# Patient Record
Sex: Male | Born: 1990 | Race: Black or African American | Hispanic: No | Marital: Single | Smoking: Never smoker
Health system: Southern US, Community
[De-identification: ages and names within clinical notes are randomized; demographics above are authoritative.]

## PROBLEM LIST (undated history)

## (undated) DIAGNOSIS — J45909 Unspecified asthma, uncomplicated: Secondary | ICD-10-CM

## (undated) DIAGNOSIS — J302 Other seasonal allergic rhinitis: Secondary | ICD-10-CM

## (undated) HISTORY — DX: Other seasonal allergic rhinitis: J30.2

## (undated) HISTORY — DX: Unspecified asthma, uncomplicated: J45.909

---
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
## 2012-01-01 NOTE — Telephone Encounter (Signed)
Pt came in to pick up Tdap shot records.

## 2014-05-06 ENCOUNTER — Ambulatory Visit (INDEPENDENT_AMBULATORY_CARE_PROVIDER_SITE_OTHER): Payer: 59

## 2014-05-06 ENCOUNTER — Ambulatory Visit (INDEPENDENT_AMBULATORY_CARE_PROVIDER_SITE_OTHER): Payer: 59 | Admitting: Family Medicine

## 2014-05-06 VITALS — BP 98/70 | HR 51 | Temp 98.1°F | Resp 16 | Ht 69.5 in | Wt 284.8 lb

## 2014-05-06 DIAGNOSIS — S8001XA Contusion of right knee, initial encounter: Secondary | ICD-10-CM

## 2014-05-06 DIAGNOSIS — M25461 Effusion, right knee: Secondary | ICD-10-CM

## 2014-05-06 NOTE — Patient Instructions (Signed)
I suspect you had a contusion (bruise) to your knee.  See information below. If swelling in knee returns, pain, feels unsteady or giving way, or other worsening - return for recheck.  Return to the clinic or go to the nearest emergency room if any of your symptoms worsen or new symptoms occur.  Contusion A contusion is a deep bruise. Contusions are the result of an injury that caused bleeding under the skin. The contusion may turn blue, purple, or yellow. Minor injuries will give you a painless contusion, but more severe contusions may stay painful and swollen for a few weeks.  CAUSES  A contusion is usually caused by a blow, trauma, or direct force to an area of the body. SYMPTOMS   Swelling and redness of the injured area.  Bruising of the injured area.  Tenderness and soreness of the injured area.  Pain. DIAGNOSIS  The diagnosis can be made by taking a history and physical exam. An X-ray, CT scan, or MRI may be needed to determine if there were any associated injuries, such as fractures. TREATMENT  Specific treatment will depend on what area of the body was injured. In general, the best treatment for a contusion is resting, icing, elevating, and applying cold compresses to the injured area. Over-the-counter medicines may also be recommended for pain control. Ask your caregiver what the best treatment is for your contusion. HOME CARE INSTRUCTIONS   Put ice on the injured area.  Put ice in a plastic bag.  Place a towel between your skin and the bag.  Leave the ice on for 15-20 minutes, 3-4 times a day, or as directed by your health care provider.  Only take over-the-counter or prescription medicines for pain, discomfort, or fever as directed by your caregiver. Your caregiver may recommend avoiding anti-inflammatory medicines (aspirin, ibuprofen, and naproxen) for 48 hours because these medicines may increase bruising.  Rest the injured area.  If possible, elevate the injured area  to reduce swelling. SEEK IMMEDIATE MEDICAL CARE IF:   You have increased bruising or swelling.  You have pain that is getting worse.  Your swelling or pain is not relieved with medicines. MAKE SURE YOU:   Understand these instructions.  Will watch your condition.  Will get help right away if you are not doing well or get worse. Document Released: 04/08/2005 Document Revised: 07/04/2013 Document Reviewed: 05/04/2011 [facility] Patient Information 2015 [place], [place]. This information is not intended to replace advice given to you by your health care provider. Make sure you discuss any questions you have with your health care provider.

## 2014-05-06 NOTE — Progress Notes (Signed)
Subjective:    Patient ID: Reginald Woods, male    DOB: 09/06/1990, 23 y.o.   MRN: [id number]  Knee Pain    Chief Complaint  Patient presents with  . Knee Pain    right knee pain.  has a lump on his knee x 1 week.     This chart was scribed for Meredith StaggersJeffrey Natina Wiginton, MD by Andrew Auaven Small, ED Scribe. This patient was seen in room 8 and the patient's care was started at 2:02 PM.  HPI Comments: Reginald Woods is a 23 y.o. male who presents to the [facility] complaining of a lump in right noticed 1 week ago. Pt reports 2 weeks ago he bumped his knee on another persons knee during basketball practice. Pt reports swelling to entire knee at that time, but had gotten better. 1 week after injury, he noticed a lump in upper knee. Pt denies lump being painful. Pt reports normal gait. He denies taking medication. He denies trouble with running cutting and twisting with basketball.     There are no active problems to display for this patient.  Past Medical History  Diagnosis Date  . Seasonal allergies   . Asthma    No past surgical history on file. No Known Allergies Prior to Admission medications   Medication Sig Start Date End Date Taking? Authorizing Provider  albuterol (PROVENTIL HFA;VENTOLIN HFA) 108 (90 BASE) MCG/ACT inhaler Inhale 2 puffs into the lungs every 6 (six) hours as needed for wheezing or shortness of breath.   Yes Historical Provider, MD   History   Social History  . Marital Status: Single    Spouse Name: N/A    Number of Children: N/A  . Years of Education: N/A   Occupational History  . Not on file.   Social History Main Topics  . Smoking status: Never Smoker   . Smokeless tobacco: Never Used  . Alcohol Use: No  . Drug Use: No  . Sexual Activity: Not on file   Other Topics Concern  . Not on file   Social History Narrative  . No narrative on file   Review of Systems  Musculoskeletal: Negative for arthralgias, gait problem and myalgias.  Skin:  Negative for rash and wound.    Objective:   Physical Exam  Nursing note and vitals reviewed. Constitutional: He is oriented to person, place, and time. He appears well-developed and well-nourished. No distress.  HENT:  Head: Normocephalic and atraumatic.  Eyes: Conjunctivae and EOM are normal.  Neck: Neck supple.  Cardiovascular: Normal rate.   Pulmonary/Chest: Effort normal.  Musculoskeletal: Normal range of motion.  Right knee- full and equal flexion. Full extension. No apparent effusion. Patella non tender. Patella tendon non tender. Joint line non tender. Fibula non tender. Negative varus and valgus. Somewhat difficulty during exam but lockmens appears negative.  There is a small bruise witch is superior and lateral to patella but non tender.  Neurological: He is alert and oriented to person, place, and time.  Skin: Skin is warm and dry.  Psychiatric: He has a normal mood and affect. His behavior is normal.   Filed Vitals:   05/06/14 1336  BP: 98/70  Pulse: 51  Temp: 98.1 F (36.7 C)  TempSrc: Oral  Resp: 16  Height: 5' 9.5" (1.765 m)  Weight: 284 lb 12.8 oz (129.184 kg)  SpO2: 99%   [facility] reading (PRIMARY) by Dr. Neva SeatGreene. Right knee. Negative X-Ray for fractures. No foreign bodies.   Assessment &  Plan:   Reginald Woods is a 23 y.o. male Knee swelling, right - Plan: DG Knee Complete 4 Views Right  Contusion of knee, right, initial encounter - Plan: DG Knee Complete 4 Views Right  Suspected soft tissue contusion, nontender and no bony ttp. No appreciable joint effusion or mechanical joint sx's at this time. Sx care and rtc precautions discussed as below.   Meds ordered this encounter  Medications  . albuterol (PROVENTIL HFA;VENTOLIN HFA) 108 (90 BASE) MCG/ACT inhaler    Sig: Inhale 2 puffs into the lungs every 6 (six) hours as needed for wheezing or shortness of breath.   Patient Instructions  I suspect you had a contusion (bruise) to your knee.  See information  below. If swelling in knee returns, pain, feels unsteady or giving way, or other worsening - return for recheck.  Return to the clinic or go to the nearest emergency room if any of your symptoms worsen or new symptoms occur.  Contusion A contusion is a deep bruise. Contusions are the result of an injury that caused bleeding under the skin. The contusion may turn blue, purple, or yellow. Minor injuries will give you a painless contusion, but more severe contusions may stay painful and swollen for a few weeks.  CAUSES  A contusion is usually caused by a blow, trauma, or direct force to an area of the body. SYMPTOMS   Swelling and redness of the injured area.  Bruising of the injured area.  Tenderness and soreness of the injured area.  Pain. DIAGNOSIS  The diagnosis can be made by taking a history and physical exam. An X-ray, CT scan, or MRI may be needed to determine if there were any associated injuries, such as fractures. TREATMENT  Specific treatment will depend on what area of the body was injured. In general, the best treatment for a contusion is resting, icing, elevating, and applying cold compresses to the injured area. Over-the-counter medicines may also be recommended for pain control. Ask your caregiver what the best treatment is for your contusion. HOME CARE INSTRUCTIONS   Put ice on the injured area.  Put ice in a plastic bag.  Place a towel between your skin and the bag.  Leave the ice on for 15-20 minutes, 3-4 times a day, or as directed by your health care provider.  Only take over-the-counter or prescription medicines for pain, discomfort, or fever as directed by your caregiver. Your caregiver may recommend avoiding anti-inflammatory medicines (aspirin, ibuprofen, and naproxen) for 48 hours because these medicines may increase bruising.  Rest the injured area.  If possible, elevate the injured area to reduce swelling. SEEK IMMEDIATE MEDICAL CARE IF:   You have  increased bruising or swelling.  You have pain that is getting worse.  Your swelling or pain is not relieved with medicines. MAKE SURE YOU:   Understand these instructions.  Will watch your condition.  Will get help right away if you are not doing well or get worse. Document Released: 04/08/2005 Document Revised: 07/04/2013 Document Reviewed: 05/04/2011 [facility] Patient Information 2015 [place], [place]. This information is not intended to replace advice given to you by your health care provider. Make sure you discuss any questions you have with your health care provider.     I personally performed the services described in this documentation, which was scribed in my presence. The recorded information has been reviewed and considered, and addended by me as needed.

## 2015-05-04 ENCOUNTER — Emergency Department (HOSPITAL_COMMUNITY)
Admission: EM | Admit: 2015-05-04 | Discharge: 2015-05-04 | Disposition: A | Discharge status: Home / Self Care | Payer: 59 | Source: Home / Self Care | Attending: Family Medicine | Admitting: Family Medicine

## 2015-05-04 ENCOUNTER — Emergency Department (INDEPENDENT_AMBULATORY_CARE_PROVIDER_SITE_OTHER): Payer: 59

## 2015-05-04 DIAGNOSIS — S92301A Fracture of unspecified metatarsal bone(s), right foot, initial encounter for closed fracture: Secondary | ICD-10-CM

## 2015-05-04 NOTE — ED Notes (Signed)
[facility] a [facility] of [facility] injury to his right foot. The patient stated that he was playing basketball and came down on his foot and heard " a bone crack" and felt pain. [facility] post op [facility].

## 2015-05-04 NOTE — Discharge Instructions (Signed)
It is a pleasure to see you today.  The pain in your right foot is from a fracture in the fifth metatarsal bone (see the print-out of the x-ray).  DO NOT BEAR WEIGHT ON YOUR RIGHT FOOT UNTIL YOU SEE THE ORTHOPEDIC DOCTOR.  Call their office on Monday morning to arrange to be seen.  We are giving you crutches; use the post-op shoe you have until then.   You may use ibuprofen 200mg  tablets, take 2 to 4 tablets by mouth every 6 to 8 hours as needed for pain and swelling.  Elevate the foot when possible.    Metatarsal Fracture A metatarsal fracture is a break in a metatarsal bone. Metatarsal bones connect your toe bones to your ankle bones. CAUSES This type of fracture may be caused by:  A sudden twisting of your foot.  A fall onto your foot.  Overuse or repetitive exercise. RISK FACTORS This condition is more likely to develop in people who:  Play contact sports.  Have a bone disease.  Have a low calcium level. SYMPTOMS Symptoms of this condition include:  Pain that is worse when walking or standing.  Pain when pressing on the foot or moving the toes.  Swelling.  Bruising on the top or bottom of the foot.  A foot that appears shorter than the other one. DIAGNOSIS This condition is diagnosed with a physical exam. You may also have imaging tests, such as:  X-rays.  A CT scan.  MRI. TREATMENT Treatment for this condition depends on its severity and whether a bone has moved out of place. Treatment may involve:  Rest.  Wearing foot support such as a cast, splint, or boot for several weeks.  Using crutches.  Surgery to move bones back into the right position. Surgery is usually needed if there are many pieces of broken bone or bones that are very out of place (displaced fracture).  Physical therapy. This may be needed to help you regain full movement and strength in your foot. You will need to return to your health care provider to have X-rays taken until your bones  heal. Your health care provider will look at the X-rays to make sure that your foot is healing well. HOME CARE INSTRUCTIONS  If You Have a Cast:  Do not stick anything inside the cast to scratch your skin. Doing that increases your risk of infection.  Check the skin around the cast every day. Report any concerns to your health care provider. You may put lotion on dry skin around the edges of the cast. Do not apply lotion to the skin underneath the cast.  Keep the cast clean and dry. If You Have a Splint or a Supportive Boot:  Wear it as directed by your health care provider. Remove it only as directed by your health care provider.  Loosen it if your toes become numb and tingle, or if they turn cold and blue.  Keep it clean and dry. Bathing  Do not take baths, swim, or use a hot tub until your health care provider approves. Ask your health care provider if you can take showers. You may only be allowed to take sponge baths for bathing.  If your health care provider approves bathing and showering, cover the cast or splint with a watertight plastic bag to protect it from water. Do not let the cast or splint get wet. Managing Pain, Stiffness, and Swelling  If directed, apply ice to the injured area (if you have a  splint, not a cast).  Put ice in a plastic bag.  Place a towel between your skin and the bag.  Leave the ice on for 20 minutes, 2-3 times per day.  Move your toes often to avoid stiffness and to lessen swelling.  Raise (elevate) the injured area above the level of your heart while you are sitting or lying down. Driving  Do not drive or operate heavy machinery while taking pain medicine.  Do not drive while wearing foot support on a foot that you use for driving. Activity  Return to your normal activities as directed by your health care provider. Ask your health care provider what activities are safe for you.  Perform exercises as directed by your health care provider or  physical therapist. Safety  Do not use the injured foot to support your body weight until your health care provider says that you can. Use crutches as directed by your health care provider. General Instructions  Do not put pressure on any part of the cast or splint until it is fully hardened. This may take several hours.  Do not use any tobacco products, including cigarettes, chewing tobacco, or e-cigarettes. Tobacco can delay bone healing. If you need help quitting, ask your health care provider.  Take medicines only as directed by your health care provider.  Keep all follow-up visits as directed by your health care provider. This is important. SEEK MEDICAL CARE IF:  You have a fever.  Your cast, splint, or boot is too loose or too tight.  Your cast, splint, or boot is damaged.  Your pain medicine is not helping.  You have pain, tingling, or numbness in your foot that is not going away. SEEK IMMEDIATE MEDICAL CARE IF:  You have severe pain.  You have tingling or numbness in your foot that is getting worse.  Your foot feels cold or becomes numb.  Your foot changes color.   This information is not intended to replace advice given to you by your health care provider. Make sure you discuss any questions you have with your health care provider.   Document Released: 03/21/2002 Document Revised: 11/13/2014 Document Reviewed: 04/25/2014 Elsevier Interactive Patient Education [employer]

## 2015-05-04 NOTE — ED Provider Notes (Signed)
CSN: [id number]     Arrival date & time 05/04/15  1840 History   First MD Initiated Contact with Patient 05/04/15 1938     Chief Complaint  Patient presents with  . Foot Injury   (Consider location/radiation/quality/duration/timing/severity/associated sxs/prior Treatment) Patient is a 24 y.o. male presenting with foot injury. The history is provided by the patient. No language interpreter was used.  Foot Injury Associated symptoms: no fever   [facility] of sudden onset R mid foot pain while playing basketball at 5pm this afternoon. Came down on the R foot, heard a pop and sudden severe pain. Has not been able to bear weight since that time.  Had a post-op shoe from prior nail avulsion procedure, [facility]   No prior fracture to R foot.   Past Medical History  Diagnosis Date  . Seasonal allergies   . Asthma    No past surgical history on file. No family history on file. Social History  Substance Use Topics  . Smoking status: Never Smoker   . Smokeless tobacco: Never Used  . Alcohol Use: No    Review of Systems  Constitutional: Negative for fever and chills.    Allergies  Review of patient's allergies indicates no known allergies.  Home Medications   Prior to Admission medications   Medication Sig Start Date End Date Taking? Authorizing Provider  albuterol (PROVENTIL HFA;VENTOLIN HFA) 108 (90 BASE) MCG/ACT inhaler Inhale 2 puffs into the lungs every 6 (six) hours as needed for wheezing or shortness of breath.    Historical Provider, MD   Meds Ordered and Administered this Visit  Medications - No data to display  BP 127/81 mmHg  Pulse 96  Temp(Src) 98.5 F (36.9 C) (Oral)  SpO2 97% No data found.   Physical Exam  Constitutional:  Generally well appearing, no distress. Sitting with R lower extremity elevated on stool   Neck: Neck supple.  Musculoskeletal:  R foot with palpable dp pulse. Sensation in distal toes  intact in all digits, cap refill <3 seconds in all toes.  Point tenderness over the R fifth metatarsal.    Full active ROM R ankle.  No tenderness to squeeze medial and lateral malleoli of R ankle.     ED Course  Procedures (including critical care time)  Labs Review Labs Reviewed - No data to display  Imaging Review Dg Foot Complete Right  05/04/2015  CLINICAL DATA:  Right foot pain/injury EXAM: RIGHT FOOT COMPLETE - 3+ VIEW COMPARISON:  None. FINDINGS: Nondisplaced fracture involving the proximal shaft of the 5th metatarsal. However, the appearance suggests the possibility of a prior underlying stress reaction in this location. No additional fracture is seen. The joint spaces are preserved. Overlying soft tissue swelling. IMPRESSION: Nondisplaced fracture involving the proximal shaft of the 5th metatarsal, as described above. Electronically Signed   By: Charline BillsSriyesh  Krishnan M.D.   On: 05/04/2015 19:42     Visual Acuity Review  Right Eye Distance:   Left Eye Distance:   Bilateral Distance:    Right Eye Near:   Left Eye Near:    Bilateral Near:         MDM   1. Fracture of fifth metatarsal bone, right, closed, initial encounter    Nondisplaced fracture of proximal R metatarsal bone.  He has his own post-op shoe. Non-weight bearing, crutches, given contact information for Orthopedics on-call to call on Monday and arrange visit.  Barbaraann Barthel, MD 05/04/15 [phone]

## 2015-05-09 ENCOUNTER — Other Ambulatory Visit: Payer: Self-pay | Admitting: Orthopedic Surgery

## 2015-05-13 ENCOUNTER — Encounter (HOSPITAL_BASED_OUTPATIENT_CLINIC_OR_DEPARTMENT_OTHER): Payer: Self-pay | Admitting: *Deleted

## 2015-05-15 ENCOUNTER — Encounter (HOSPITAL_BASED_OUTPATIENT_CLINIC_OR_DEPARTMENT_OTHER)
Admission: RE | Disposition: A | Discharge status: Home / Self Care | Payer: Self-pay | Source: Ambulatory Visit | Attending: Orthopedic Surgery

## 2015-05-15 ENCOUNTER — Encounter (HOSPITAL_BASED_OUTPATIENT_CLINIC_OR_DEPARTMENT_OTHER): Payer: Self-pay | Admitting: *Deleted

## 2015-05-15 ENCOUNTER — Ambulatory Visit (HOSPITAL_BASED_OUTPATIENT_CLINIC_OR_DEPARTMENT_OTHER)
Admission: RE | Admit: 2015-05-15 | Discharge: 2015-05-15 | Disposition: A | Discharge status: Home / Self Care | Payer: 59 | Source: Ambulatory Visit | Attending: Orthopedic Surgery | Admitting: Orthopedic Surgery

## 2015-05-15 ENCOUNTER — Ambulatory Visit (HOSPITAL_BASED_OUTPATIENT_CLINIC_OR_DEPARTMENT_OTHER): Payer: 59 | Admitting: Anesthesiology

## 2015-05-15 DIAGNOSIS — S92351A Displaced fracture of fifth metatarsal bone, right foot, initial encounter for closed fracture: Secondary | ICD-10-CM | POA: Diagnosis present

## 2015-05-15 DIAGNOSIS — Z79899 Other long term (current) drug therapy: Secondary | ICD-10-CM | POA: Diagnosis not present

## 2015-05-15 DIAGNOSIS — W010XXA Fall on same level from slipping, tripping and stumbling without subsequent striking against object, initial encounter: Secondary | ICD-10-CM | POA: Insufficient documentation

## 2015-05-15 DIAGNOSIS — Z91013 Allergy to seafood: Secondary | ICD-10-CM | POA: Diagnosis not present

## 2015-05-15 DIAGNOSIS — J45909 Unspecified asthma, uncomplicated: Secondary | ICD-10-CM | POA: Diagnosis not present

## 2015-05-15 HISTORY — PX: ORIF TOE FRACTURE: SHX5032

## 2015-05-15 SURGERY — OPEN REDUCTION INTERNAL FIXATION (ORIF) METATARSAL (TOE) FRACTURE
Anesthesia: General | Site: Foot | Laterality: Right

## 2015-05-15 MED ORDER — OXYCODONE HCL 5 MG PO TABS
5.0000 mg | ORAL_TABLET | Freq: Once | ORAL | Status: AC
  Administered 2015-05-15: 5 mg via ORAL

## 2015-05-15 MED ORDER — DEXAMETHASONE SODIUM PHOSPHATE 4 MG/ML IJ SOLN
INTRAMUSCULAR | Status: DC | PRN
  Administered 2015-05-15: 10 mg via INTRAVENOUS

## 2015-05-15 MED ORDER — LIDOCAINE HCL (CARDIAC) 20 MG/ML IV SOLN
INTRAVENOUS | Status: AC
  Filled 2015-05-15: qty 5

## 2015-05-15 MED ORDER — LACTATED RINGERS IV SOLN: INTRAVENOUS | Status: DC

## 2015-05-15 MED ORDER — MIDAZOLAM HCL 2 MG/2ML IJ SOLN
1.0000 mg | INTRAMUSCULAR | Status: DC | PRN
  Administered 2015-05-15: 1 mg via INTRAVENOUS

## 2015-05-15 MED ORDER — OXYCODONE-ACETAMINOPHEN 5-325 MG PO TABS: 1.0000 | ORAL_TABLET | Freq: Four times a day (QID) | ORAL | Status: DC | PRN

## 2015-05-15 MED ORDER — FENTANYL CITRATE (PF) 100 MCG/2ML IJ SOLN
INTRAMUSCULAR | Status: AC
  Filled 2015-05-15: qty 4

## 2015-05-15 MED ORDER — ASPIRIN EC 325 MG PO TBEC: 325.0000 mg | DELAYED_RELEASE_TABLET | Freq: Every day | ORAL | Status: DC

## 2015-05-15 MED ORDER — BUPIVACAINE-EPINEPHRINE (PF) 0.5% -1:200000 IJ SOLN
INTRAMUSCULAR | Status: DC | PRN
  Administered 2015-05-15: 30 mL via PERINEURAL

## 2015-05-15 MED ORDER — KETOROLAC TROMETHAMINE 30 MG/ML IJ SOLN
INTRAMUSCULAR | Status: DC | PRN
  Administered 2015-05-15: 30 mg via INTRAVENOUS

## 2015-05-15 MED ORDER — FENTANYL CITRATE (PF) 100 MCG/2ML IJ SOLN
INTRAMUSCULAR | Status: AC
  Filled 2015-05-15: qty 2

## 2015-05-15 MED ORDER — MEPERIDINE HCL 25 MG/ML IJ SOLN: 6.2500 mg | INTRAMUSCULAR | Status: DC | PRN

## 2015-05-15 MED ORDER — ONDANSETRON HCL 4 MG/2ML IJ SOLN
INTRAMUSCULAR | Status: DC | PRN
  Administered 2015-05-15: 4 mg via INTRAVENOUS

## 2015-05-15 MED ORDER — CEFAZOLIN SODIUM 1-5 GM-% IV SOLN
INTRAVENOUS | Status: AC
  Filled 2015-05-15: qty 50

## 2015-05-15 MED ORDER — PROMETHAZINE HCL 25 MG/ML IJ SOLN: 6.2500 mg | INTRAMUSCULAR | Status: DC | PRN

## 2015-05-15 MED ORDER — CEFAZOLIN SODIUM-DEXTROSE 2-3 GM-% IV SOLR
2.0000 g | INTRAVENOUS | Status: AC
  Administered 2015-05-15: 2 g via INTRAVENOUS

## 2015-05-15 MED ORDER — SCOPOLAMINE 1 MG/3DAYS TD PT72: 1.0000 | MEDICATED_PATCH | Freq: Once | TRANSDERMAL | Status: DC | PRN

## 2015-05-15 MED ORDER — PROPOFOL 10 MG/ML IV BOLUS
INTRAVENOUS | Status: DC | PRN
  Administered 2015-05-15: 30 mg via INTRAVENOUS
  Administered 2015-05-15: 200 mg via INTRAVENOUS

## 2015-05-15 MED ORDER — LIDOCAINE HCL (CARDIAC) 20 MG/ML IV SOLN
INTRAVENOUS | Status: DC | PRN
  Administered 2015-05-15: 50 mg via INTRAVENOUS

## 2015-05-15 MED ORDER — CHLORHEXIDINE GLUCONATE 4 % EX LIQD: 60.0000 mL | Freq: Once | CUTANEOUS | Status: DC

## 2015-05-15 MED ORDER — DEXAMETHASONE SODIUM PHOSPHATE 10 MG/ML IJ SOLN
INTRAMUSCULAR | Status: DC | PRN
  Administered 2015-05-15: 10 mg via INTRAVENOUS

## 2015-05-15 MED ORDER — GLYCOPYRROLATE 0.2 MG/ML IJ SOLN: 0.2000 mg | Freq: Once | INTRAMUSCULAR | Status: DC | PRN

## 2015-05-15 MED ORDER — CEFAZOLIN SODIUM-DEXTROSE 2-3 GM-% IV SOLR
INTRAVENOUS | Status: AC
  Filled 2015-05-15: qty 50

## 2015-05-15 MED ORDER — HYDROMORPHONE HCL 1 MG/ML IJ SOLN: 0.2500 mg | INTRAMUSCULAR | Status: DC | PRN

## 2015-05-15 MED ORDER — FENTANYL CITRATE (PF) 100 MCG/2ML IJ SOLN
INTRAMUSCULAR | Status: DC | PRN
  Administered 2015-05-15: 100 ug via INTRAVENOUS

## 2015-05-15 MED ORDER — FENTANYL CITRATE (PF) 100 MCG/2ML IJ SOLN
50.0000 ug | INTRAMUSCULAR | Status: DC | PRN
  Administered 2015-05-15: 100 ug via INTRAVENOUS

## 2015-05-15 MED ORDER — MIDAZOLAM HCL 2 MG/2ML IJ SOLN
INTRAMUSCULAR | Status: AC
  Filled 2015-05-15: qty 4

## 2015-05-15 MED ORDER — PROPOFOL 10 MG/ML IV BOLUS
INTRAVENOUS | Status: AC
  Filled 2015-05-15: qty 20

## 2015-05-15 MED ORDER — DEXAMETHASONE SODIUM PHOSPHATE 10 MG/ML IJ SOLN
INTRAMUSCULAR | Status: AC
  Filled 2015-05-15: qty 1

## 2015-05-15 MED ORDER — KETOROLAC TROMETHAMINE 30 MG/ML IJ SOLN
INTRAMUSCULAR | Status: AC
  Filled 2015-05-15: qty 1

## 2015-05-15 MED ORDER — LACTATED RINGERS IV SOLN
INTRAVENOUS | Status: DC
  Administered 2015-05-15 (×2): via INTRAVENOUS

## 2015-05-15 MED ORDER — ONDANSETRON HCL 4 MG/2ML IJ SOLN
INTRAMUSCULAR | Status: AC
  Filled 2015-05-15: qty 2

## 2015-05-15 MED ORDER — MIDAZOLAM HCL 5 MG/5ML IJ SOLN
INTRAMUSCULAR | Status: DC | PRN
  Administered 2015-05-15: 2 mg via INTRAVENOUS

## 2015-05-15 MED ORDER — MIDAZOLAM HCL 2 MG/2ML IJ SOLN
INTRAMUSCULAR | Status: AC
  Filled 2015-05-15: qty 2

## 2015-05-15 MED ORDER — OXYCODONE HCL 5 MG PO TABS
ORAL_TABLET | ORAL | Status: AC
  Filled 2015-05-15: qty 1

## 2015-05-15 SURGICAL SUPPLY — 72 items
5.5 x 60 Jones Screw (Screw) ×3 IMPLANT
BANDAGE COBAN STERILE 2 (GAUZE/BANDAGES/DRESSINGS) ×3 IMPLANT
BANDAGE ELASTIC 3 VELCRO ST LF (GAUZE/BANDAGES/DRESSINGS) IMPLANT
BANDAGE ELASTIC 4 VELCRO ST LF (GAUZE/BANDAGES/DRESSINGS) ×6 IMPLANT
BANDAGE ELASTIC 6 VELCRO ST LF (GAUZE/BANDAGES/DRESSINGS) IMPLANT
BANDAGE ESMARK 6X9 LF (GAUZE/BANDAGES/DRESSINGS) ×1 IMPLANT
BIT DRILL CAN 3.5MM (BIT) ×1
BIT DRILL CANN 3.5 (BIT) ×1
BIT DRILL SRG 3.5XCAN FFTH MTR (BIT) ×1 IMPLANT
BIT DRL SRG 3.5XCANN FIFTH MTR (BIT) ×1
BLADE SURG 15 STRL LF DISP TIS (BLADE) ×2 IMPLANT
BLADE SURG 15 STRL SS (BLADE) ×4
BNDG ESMARK 4X9 LF (GAUZE/BANDAGES/DRESSINGS) IMPLANT
BNDG ESMARK 6X9 LF (GAUZE/BANDAGES/DRESSINGS) ×3
BNDG GAUZE 1X2.1 STRL (MISCELLANEOUS) ×3 IMPLANT
CANISTER SUCT 1200ML W/VALVE (MISCELLANEOUS) IMPLANT
COVER BACK TABLE 60X90IN (DRAPES) ×3 IMPLANT
COVER MAYO STAND STRL (DRAPES) ×3 IMPLANT
DECANTER SPIKE VIAL GLASS SM (MISCELLANEOUS) IMPLANT
DRAPE EXTREMITY T 121X128X90 (DRAPE) ×3 IMPLANT
DRAPE OEC MINIVIEW 54X84 (DRAPES) ×3 IMPLANT
DRAPE U 20/CS (DRAPES) ×3 IMPLANT
DRAPE U-SHAPE 47X51 STRL (DRAPES) ×3 IMPLANT
DURAPREP 26ML APPLICATOR (WOUND CARE) IMPLANT
ELECT NEEDLE TIP 2.8 STRL (NEEDLE) IMPLANT
ELECT REM PT RETURN 9FT ADLT (ELECTROSURGICAL)
ELECTRODE REM PT RTRN 9FT ADLT (ELECTROSURGICAL) IMPLANT
GAUZE SPONGE 4X4 12PLY STRL (GAUZE/BANDAGES/DRESSINGS) ×3 IMPLANT
GAUZE XEROFORM 1X8 LF (GAUZE/BANDAGES/DRESSINGS) ×3 IMPLANT
GLOVE BIO SURGEON STRL SZ7.5 (GLOVE) ×3 IMPLANT
GLOVE BIOGEL PI IND STRL 7.0 (GLOVE) ×2 IMPLANT
GLOVE BIOGEL PI IND STRL 8 (GLOVE) ×2 IMPLANT
GLOVE BIOGEL PI INDICATOR 7.0 (GLOVE) ×4
GLOVE BIOGEL PI INDICATOR 8 (GLOVE) ×4
GLOVE ECLIPSE 6.5 STRL STRAW (GLOVE) ×3 IMPLANT
GLOVE ECLIPSE 7.5 STRL STRAW (GLOVE) ×6 IMPLANT
GOWN STRL REUS W/ TWL LRG LVL3 (GOWN DISPOSABLE) ×1 IMPLANT
GOWN STRL REUS W/ TWL XL LVL3 (GOWN DISPOSABLE) ×1 IMPLANT
GOWN STRL REUS W/TWL LRG LVL3 (GOWN DISPOSABLE) ×2
GOWN STRL REUS W/TWL XL LVL3 (GOWN DISPOSABLE) ×5 IMPLANT
GUIDEWIRE .07X8 (WIRE) ×3 IMPLANT
NEEDLE HYPO 25X1 1.5 SAFETY (NEEDLE) IMPLANT
NS IRRIG 1000ML POUR BTL (IV SOLUTION) ×3 IMPLANT
PACK BASIN DAY SURGERY FS (CUSTOM PROCEDURE TRAY) ×3 IMPLANT
PAD CAST 3X4 CTTN HI CHSV (CAST SUPPLIES) ×2 IMPLANT
PAD CAST 4YDX4 CTTN HI CHSV (CAST SUPPLIES) ×1 IMPLANT
PADDING CAST ABS 4INX4YD NS (CAST SUPPLIES) ×2
PADDING CAST ABS COTTON 4X4 ST (CAST SUPPLIES) ×1 IMPLANT
PADDING CAST COTTON 3X4 STRL (CAST SUPPLIES) ×4
PADDING CAST COTTON 4X4 STRL (CAST SUPPLIES) ×2
PADDING CAST COTTON 6X4 STRL (CAST SUPPLIES) IMPLANT
PENCIL BUTTON HOLSTER BLD 10FT (ELECTRODE) IMPLANT
SHEET MEDIUM DRAPE 40X70 STRL (DRAPES) IMPLANT
SPLINT PLASTER CAST XFAST 4X15 (CAST SUPPLIES) IMPLANT
SPLINT PLASTER XTRA FAST SET 4 (CAST SUPPLIES)
SPONGE GAUZE 2X2 8PLY STER LF (GAUZE/BANDAGES/DRESSINGS) ×1
SPONGE GAUZE 2X2 8PLY STRL LF (GAUZE/BANDAGES/DRESSINGS) ×2 IMPLANT
STOCKINETTE 4X48 STRL (DRAPES) IMPLANT
STOCKINETTE 6  STRL (DRAPES) ×2
STOCKINETTE 6 STRL (DRAPES) ×1 IMPLANT
SUCTION FRAZIER TIP 10 FR DISP (SUCTIONS) IMPLANT
SUT ETHIBOND 3-0 V-5 (SUTURE) ×3 IMPLANT
SUT ETHILON 4 0 PS 2 18 (SUTURE) IMPLANT
SUT VIC AB 4-0 P-3 18XBRD (SUTURE) ×1 IMPLANT
SUT VIC AB 4-0 P3 18 (SUTURE) ×2
SYR BULB 3OZ (MISCELLANEOUS) IMPLANT
SYR CONTROL 10ML LL (SYRINGE) IMPLANT
TOWEL OR 17X24 6PK STRL BLUE (TOWEL DISPOSABLE) ×3 IMPLANT
TOWEL OR NON WOVEN STRL DISP B (DISPOSABLE) ×3 IMPLANT
TUBE CONNECTING 20'X1/4 (TUBING)
TUBE CONNECTING 20X1/4 (TUBING) IMPLANT
UNDERPAD 30X30 (UNDERPADS AND DIAPERS) ×3 IMPLANT

## 2015-05-15 NOTE — Anesthesia Preprocedure Evaluation (Addendum)
Anesthesia Evaluation  Patient identified by MRN, date of birth, ID band Patient awake, Patient confused and Patient unresponsive    Reviewed: Allergy & Precautions, NPO status , Patient's Chart, lab work & pertinent test results  Airway Mallampati: I  TM Distance: >3 FB Neck ROM: Full    Dental  (+) Teeth Intact   Pulmonary asthma ,    breath sounds clear to auscultation       Cardiovascular negative cardio ROS   Rhythm:Regular Rate:Normal     Neuro/Psych negative neurological ROS  negative psych ROS   GI/Hepatic negative GI ROS, Neg liver ROS,   Endo/Other  negative endocrine ROS  Renal/GU negative Renal ROS  negative genitourinary   Musculoskeletal negative musculoskeletal ROS (+)   Abdominal   Peds negative pediatric ROS (+)  Hematology negative hematology ROS (+)   Anesthesia Other Findings   Reproductive/Obstetrics negative OB ROS                             Anesthesia Physical Anesthesia Plan  ASA: III  Anesthesia Plan: General   Post-op Pain Management: GA combined w/ Regional for post-op pain   Induction: Intravenous  Airway Management Planned: LMA  Additional Equipment:   Intra-op Plan:   Post-operative Plan: Extubation in OR  Informed Consent: I have reviewed the patients History and Physical, chart, labs and discussed the procedure including the risks, benefits and alternatives for the proposed anesthesia with the patient or authorized representative who has indicated his/her understanding and acceptance.   Dental advisory given  Plan Discussed with: CRNA  Anesthesia Plan Comments:         Anesthesia Quick Evaluation

## 2015-05-15 NOTE — Transfer of Care (Signed)
Immediate Anesthesia Transfer of Care Note  Patient: Reginald Woods  Procedure(s) Performed: Procedure(s): OPEN REDUCTION INTERNAL FIXATION (ORIF) RIGHT 5TH METATARSAL (TOE) FRACTURE (Right)  Patient Location: PACU  Anesthesia Type:GA combined with regional for post-op pain  Level of Consciousness: awake and patient cooperative  Airway & Oxygen Therapy: Patient Spontanous Breathing and Patient connected to face mask oxygen  Post-op Assessment: Report given to RN and Post -op Vital signs reviewed and stable  Post vital signs: Reviewed and stable  Last Vitals:  Filed Vitals:   05/15/15 1207  BP:   Pulse: 88  Temp:   Resp: 15    Complications: No apparent anesthesia complications

## 2015-05-15 NOTE — Anesthesia Procedure Notes (Addendum)
Anesthesia Regional Block:  Popliteal block  Pre-Anesthetic Checklist: ,, timeout performed, Correct Patient, Correct Site, Correct Laterality, Correct Procedure, Correct Position, site marked, Risks and benefits discussed,  Surgical consent,  Pre-op evaluation,  At surgeon's request and post-op pain management  Laterality: Right  Prep: chloraprep       Needles:  Injection technique: Single-shot  Needle Type: Stimiplex     Needle Length: 5cm 5 cm Needle Gauge: 22 and 22 G    Additional Needles:  Procedures: ultrasound guided (picture in chart) and nerve stimulator Popliteal block  Nerve Stimulator or Paresthesia:  Response: biceps flexion,   Additional Responses:   Narrative:  Start time: 05/15/2015 10:33 AM End time: 05/15/2015 10:37 AM Injection made incrementally with aspirations every 5 mL.  Performed by: Personally  Anesthesiologist: Shona SimpsonHOLLIS, KEVIN D  Additional Notes: Functioning IV was confirmed and monitors were applied.  A 50mm 22ga Stimuplex needle was used. Sterile prep and drape,hand hygiene and sterile gloves were used.  Negative aspiration and negative test dose prior to incremental administration of local anesthetic. The patient tolerated the procedure well.      Procedure Name: LMA Insertion Date/Time: 05/15/2015 10:52 AM Performed by: Genevieve NorlanderLINKA, Michele Judy L Pre-anesthesia Checklist: Patient identified, Emergency Drugs available, Suction available, Patient being monitored and Timeout performed Patient Re-evaluated:Patient Re-evaluated prior to inductionOxygen Delivery Method: Circle System Utilized Preoxygenation: Pre-oxygenation with 100% oxygen Intubation Type: IV induction Ventilation: Mask ventilation without difficulty LMA: LMA inserted LMA Size: 5.0 Number of attempts: 1 Airway Equipment and Method: Bite block Placement Confirmation: positive ETCO2 Tube secured with: Tape Dental Injury: Teeth and Oropharynx as per pre-operative assessment

## 2015-05-15 NOTE — Brief Op Note (Signed)
05/15/2015  12:57 PM  PATIENT:  Reginald Woods  24 y.o. male  PRE-OPERATIVE DIAGNOSIS:  JONES FRACTURE RIGHT 5TH METATARSAL  POST-OPERATIVE DIAGNOSIS:  JONES FRACTURE RIGHT 5TH METATARSAL  PROCEDURE:  Procedure(s): OPEN REDUCTION INTERNAL FIXATION (ORIF) RIGHT 5TH METATARSAL (TOE) FRACTURE (Right)  SURGEON:  Surgeon(s) and Role:    * Jodi GeraldsJohn Falyn Rubel, MD - Primary  PHYSICIAN ASSISTANT:   ASSISTANTS: bethune   ANESTHESIA:   general  EBL:  Total I/O In: 1822 [P.O.:222; I.V.:1600] Out: -   BLOOD ADMINISTERED:none  DRAINS: none   LOCAL MEDICATIONS USED:  NONE  SPECIMEN:  No Specimen  DISPOSITION OF SPECIMEN:  N/A  COUNTS:  YES  TOURNIQUET:    DICTATION: .Other Dictation: Dictation Number [id number]  PLAN OF CARE: Discharge to home after PACU  PATIENT DISPOSITION:  PACU - hemodynamically stable.   Delay start of Pharmacological VTE agent (>24hrs) due to surgical blood loss or risk of bleeding: no

## 2015-05-15 NOTE — Anesthesia Postprocedure Evaluation (Signed)
  Anesthesia Post-op Note  Patient: Reginald Woods  Procedure(s) Performed: Procedure(s): OPEN REDUCTION INTERNAL FIXATION (ORIF) RIGHT 5TH METATARSAL (TOE) FRACTURE (Right)  Patient Location: PACU  Anesthesia Type:GA combined with regional for post-op pain  Level of Consciousness: awake, alert  and oriented  Airway and Oxygen Therapy: Patient Spontanous Breathing  Post-op Pain: mild  Post-op Assessment: Post-op Vital signs reviewed and Patient's Cardiovascular Status Stable     RLE Motor Response: Purposeful movement RLE Sensation: Numbness      Post-op Vital Signs: Reviewed and stable  Last Vitals:  Filed Vitals:   05/15/15 1332  BP: 132/72  Pulse: 78  Temp: 36.4 C  Resp: 18    Complications: No apparent anesthesia complications

## 2015-05-15 NOTE — Progress Notes (Signed)
Assisted Dr. Hollis with right, ultrasound guided, popliteal block. Side rails up, monitors on throughout procedure. See vital signs in flow sheet. Tolerated Procedure well. 

## 2015-05-15 NOTE — Discharge Instructions (Signed)
Ambulate nonweightbearing on the right with crutches. Elevate your right foot as much as possible. Take 1 enteric-coated 325 mg aspirin daily 3 weeks postop to decrease risk of a blood clot.    Post Anesthesia Home Care Instructions  Activity: Get plenty of rest for the remainder of the day. A responsible adult should stay with you for 24 hours following the procedure.  For the next 24 hours, DO NOT: -Drive a car -Advertising copywriterperate machinery -Drink alcoholic beverages -Take any medication unless instructed by your physician -Make any legal decisions or sign important papers.  Meals: Start with liquid foods such as gelatin or soup. Progress to regular foods as tolerated. Avoid greasy, spicy, heavy foods. If nausea and/or vomiting occur, drink only clear liquids until the nausea and/or vomiting subsides. Call your physician if vomiting continues.  Special Instructions/Symptoms: Your throat may feel dry or sore from the anesthesia or the breathing tube placed in your throat during surgery. If this causes discomfort, gargle with warm salt water. The discomfort should disappear within 24 hours.  If you had a scopolamine patch placed behind your ear for the management of post- operative nausea and/or vomiting:  1. The medication in the patch is effective for 72 hours, after which it should be removed.  Wrap patch in a tissue and discard in the trash. Wash hands thoroughly with soap and water. 2. You may remove the patch earlier than 72 hours if you experience unpleasant side effects which may include dry mouth, dizziness or visual disturbances. 3. Avoid touching the patch. Wash your hands with soap and water after contact with the patch.   Regional Anesthesia Blocks  1. Numbness or the inability to move the "blocked" extremity may last from 3-48 hours after placement. The length of time depends on the medication injected and your individual response to the medication. If the numbness is not going  away after 48 hours, call your surgeon.  2. The extremity that is blocked will need to be protected until the numbness is gone and the  Strength has returned. Because you cannot feel it, you will need to take extra care to avoid injury. Because it may be weak, you may have difficulty moving it or using it. You may not know what position it is in without looking at it while the block is in effect.  3. For blocks in the legs and feet, returning to weight bearing and walking needs to be done carefully. You will need to wait until the numbness is entirely gone and the strength has returned. You should be able to move your leg and foot normally before you try and bear weight or walk. You will need someone to be with you when you first try to ensure you do not fall and possibly risk injury.  4. Bruising and tenderness at the needle site are common side effects and will resolve in a few days.  5. Persistent numbness or new problems with movement should be communicated to the surgeon or the [facility] [phone])/ [facility] [phone]).

## 2015-05-15 NOTE — H&P (Addendum)
  PREOPERATIVE H&P  Chief Complaint: right foot pain  HPI: Sharlyne PacasDarvin Burgert is a 24 y.o. male who presents for evaluation of right foot pain. It has been present for greater than 3 months and has been worsening. He has failed conservative measures. Pain is rated as moderate.  Past Medical History  Diagnosis Date  . Seasonal allergies   . Asthma    History reviewed. No pertinent past surgical history. Social History   Social History  . Marital Status: Single    Spouse Name: N/A  . Number of Children: N/A  . Years of Education: N/A   Social History Main Topics  . Smoking status: Never Smoker   . Smokeless tobacco: Never Used  . Alcohol Use: Yes     Comment: Ocassionally  . Drug Use: Yes    Special: Marijuana     Comment: last smoke marijuana 05/12/2015  . Sexual Activity: Yes   Other Topics Concern  . None   Social History Narrative   History reviewed. No pertinent family history. Allergies  Allergen Reactions  . Shellfish Allergy    Prior to Admission medications   Medication Sig Start Date End Date Taking? Authorizing Provider  albuterol (PROVENTIL HFA;VENTOLIN HFA) 108 (90 BASE) MCG/ACT inhaler Inhale 2 puffs into the lungs every 6 (six) hours as needed for wheezing or shortness of breath.    Historical Provider, MD     Positive ROS: none  All other systems have been reviewed and were otherwise negative with the exception of those mentioned in the HPI and as above.  Physical Exam: Filed Vitals:   05/15/15 0933  BP: 135/98  Pulse: 85  Temp: 98.1 F (36.7 C)  Resp: 20    General: Alert, no acute distress Cardiovascular: No pedal edema Respiratory: No cyanosis, no use of accessory musculature GI: No organomegaly, abdomen is soft and non-tender Skin: No lesions in the area of chief complaint Neurologic: Sensation intact distally Psychiatric: Patient is competent for consent with normal mood and affect Lymphatic: No axillary or cervical  lymphadenopathy  MUSCULOSKELETAL: right foot: Tender to palpation over the base the fifth metatarsal.  Painful range of motion.  Painful eversion.  Moderate soft tissue swelling.  X-ray: X-rays show incomplete healing of a fifth metatarsal fracture   Assessment/Plan: JONES FRACTURE RIGHT 5TH METATARSAL having failed conservative care with cast immobilization for a prolonged period of time  Plan for Procedure(s): OPEN REDUCTION INTERNAL FIXATION (ORIF) RIGHT 5TH METATARSAL (TOE) FRACTURE  The risks benefits and alternatives were discussed with the patient including but not limited to the risks of nonoperative treatment, versus surgical intervention including infection, bleeding, nerve injury, malunion, nonunion, hardware prominence, hardware failure, need for hardware removal, blood clots, cardiopulmonary complications, morbidity, mortality, among others, and they were willing to proceed.  Predicted outcome is good, although there will be at least a six to nine month expected recovery.  Gilberto Stanforth L, MD 05/15/2015 10:09 AM

## 2015-05-16 ENCOUNTER — Encounter (HOSPITAL_BASED_OUTPATIENT_CLINIC_OR_DEPARTMENT_OTHER): Payer: Self-pay | Admitting: Orthopedic Surgery

## 2015-05-16 NOTE — Op Note (Signed)
NAMSharlyne Pacas:  Woods, Reginald Woods               ACCOUNT NO.:  [id number]  MEDICAL RECORD NO.:  [id number]  LOCATION:                               FACILITY:  [facility]  PHYSICIAN:  Harvie JuniorJohn L. Wayne Brunker, M.D.   DATE OF BIRTH:  April 12, 1991  DATE OF PROCEDURE:  05/15/2015 DATE OF DISCHARGE:  05/15/2015                              OPERATIVE REPORT   PREOPERATIVE DIAGNOSIS:  True Jones fifth metatarsal fracture, right.  POSTOPERATIVE DIAGNOSIS:  True Jones fifth metatarsal fracture, right.  PROCEDURE: 1. Open reduction and internal fixation of right fifth metatarsal fracture with 5.5 mm solid compression screw. 2.interpretation of multiple intra-operative fluoroscopic images SURGEON:  Harvie JuniorJohn L. Kailer Heindel, M.D.  ASSISTANT:  Marshia LyJames Bethune, P.A.  ANESTHESIA:  General.  BRIEF HISTORY:  Mr. [name] is a 24 year old male with a history of slipping and having a true Jones fracture, who had been treated conservatively for several weeks.  In consultation with his physician, they felt that he was not desirous of additional casting and had discussions about operative intervention given that it was a true Jones fracture in a young age person, fairly active, we felt that internal fixation was appropriate.  He was brought to the operating room for this procedure.  DESCRIPTION OF PROCEDURE:  The patient was brought to the operating room.  After adequate anesthesia was obtained with general anesthetic, the patient was placed supine on the operating table.  The right leg was prepped and draped in usual sterile fashion.  Following this, a guidewire was used to find the central portion of the AP and lateral with fluoro, just drawing lines on the skin.  Once we are able to identify the starting point, we sort of followed that down and then used the starting point to get in the high point of the fifth metatarsal.  We then on AP put the guidewire down the center of the fifth metatarsal on both AP and lateral fluoro.  Once this  was done, we drilled it and tapped it with a 4.5, really was not tight, and I did a 5.5 that got pretty good purchase.  I opened a 60 mm 5.5 screw and advanced this down the shaft under fluoro.  With direct vision on fluoro, we kept the screw head engaging, had excellent compression across the fracture site.  Once this was done, the wound was irrigated, suctioned dry, and closed with nylon interrupted sutures.  Multiple fluoroscopic images were taken, interpreted by me during the surgery. The patient was taken to the recovery room, he was noted to be in satisfactory condition.  Estimated blood loss for the procedure was minimal.     Harvie JuniorJohn L. Atia Haupt, M.D.     Ranae PlumberJLG/MEDQ  D:  05/15/2015  T:  05/16/2015  Job:  [id number]  cc:   Harvie JuniorJohn L. Zakkiyya Barno, M.D.

## 2015-09-12 ENCOUNTER — Emergency Department (HOSPITAL_COMMUNITY)
Admission: EM | Admit: 2015-09-12 | Discharge: 2015-09-12 | Disposition: A | Discharge status: Home / Self Care | Payer: 59 | Source: Home / Self Care | Attending: Family Medicine | Admitting: Family Medicine

## 2015-09-12 ENCOUNTER — Encounter (HOSPITAL_COMMUNITY): Payer: Self-pay | Admitting: *Deleted

## 2015-09-12 DIAGNOSIS — J111 Influenza due to unidentified influenza virus with other respiratory manifestations: Secondary | ICD-10-CM

## 2015-09-12 DIAGNOSIS — R69 Illness, unspecified: Principal | ICD-10-CM

## 2015-09-12 MED ORDER — OSELTAMIVIR PHOSPHATE 75 MG PO CAPS: 75.0000 mg | ORAL_CAPSULE | Freq: Two times a day (BID) | ORAL | Status: DC

## 2015-09-12 MED ORDER — IPRATROPIUM BROMIDE 0.06 % NA SOLN: 2.0000 | Freq: Four times a day (QID) | NASAL | Status: DC

## 2015-09-12 NOTE — Discharge Instructions (Signed)
Drink plenty of fluids as discussed, use medicine as prescribed, and mucinex or delsym for cough. Return or see your doctor if further problems °

## 2015-09-12 NOTE — ED Notes (Signed)
Pt  Reports  Symptoms  Of  Body  Aches   Chills       sorethroat     With  Symptoms  Starting  Today           Pt  Sitting  Upright  On  Exam  Today     Speaking  In  Complete  sentances       In  No  Distress        At  This  Time       Symptoms  Not  releived  By  otc  meds

## 2015-09-12 NOTE — ED Provider Notes (Signed)
CSN: [id number]     Arrival date & time 09/12/15  1806 History   First MD Initiated Contact with Patient 09/12/15 1918     Chief Complaint  Patient presents with  . Generalized Body Aches   (Consider location/radiation/quality/duration/timing/severity/associated sxs/prior Treatment) Patient is a 25 y.o. male presenting with flu symptoms.  Influenza Presenting symptoms: cough, fever, myalgias and rhinorrhea   Presenting symptoms: no diarrhea, no nausea and no vomiting   Severity:  Moderate Onset quality:  Sudden Duration:  1 day Progression:  Worsening Chronicity:  New Relieved by:  None tried Ineffective treatments:  None tried Associated symptoms: chills and nasal congestion     Past Medical History  Diagnosis Date  . Seasonal allergies   . Asthma    Past Surgical History  Procedure Laterality Date  . Orif toe fracture Right 05/15/2015    Procedure: OPEN REDUCTION INTERNAL FIXATION (ORIF) RIGHT 5TH METATARSAL (TOE) FRACTURE;  Surgeon: Jodi Geralds, MD;  Location: [facility];  Service: Orthopedics;  Laterality: Right;   History reviewed. No pertinent family history. Social History  Substance Use Topics  . Smoking status: Never Smoker   . Smokeless tobacco: Never Used  . Alcohol Use: Yes     Comment: Ocassionally    Review of Systems  Constitutional: Positive for fever and chills.  HENT: Positive for congestion, postnasal drip and rhinorrhea.   Respiratory: Positive for cough.   Cardiovascular: Negative.   Gastrointestinal: Negative for nausea, vomiting and diarrhea.  Genitourinary: Negative.   Musculoskeletal: Positive for myalgias.  All other systems reviewed and are negative.   Allergies  Shellfish allergy  Home Medications   Prior to Admission medications   Medication Sig Start Date End Date Taking? Authorizing Provider  albuterol (PROVENTIL HFA;VENTOLIN HFA) 108 (90 BASE) MCG/ACT inhaler Inhale 2 puffs into the lungs every 6 (six) hours as  needed for wheezing or shortness of breath.    Historical Provider, MD  aspirin EC 325 MG tablet Take 1 tablet (325 mg total) by mouth daily. Take 3 weeks postop with food to decrease risk of blood clot. 05/15/15   Marshia Ly, PA-C  ipratropium (ATROVENT) 0.06 % nasal spray Place 2 sprays into both nostrils 4 (four) times daily. 09/12/15   Linna Hoff, MD  oseltamivir (TAMIFLU) 75 MG capsule Take 1 capsule (75 mg total) by mouth every 12 (twelve) hours. Take all of medication. 09/12/15   Linna Hoff, MD  oxyCODONE-acetaminophen (PERCOCET/ROXICET) 5-325 MG tablet Take 1-2 tablets by mouth every 6 (six) hours as needed for severe pain. 05/15/15   Marshia Ly, PA-C   Meds Ordered and Administered this Visit  Medications - No data to display  BP 128/86 mmHg  Pulse 78  Temp(Src) 98.6 F (37 C)  Resp 18  SpO2 99% No data found.   Physical Exam  Constitutional: He is oriented to person, place, and time. He appears well-developed and well-nourished. No distress.  HENT:  Right Ear: External ear normal.  Left Ear: External ear normal.  Mouth/Throat: Oropharynx is clear and moist.  Neck: Normal range of motion. Neck supple.  Cardiovascular: Regular rhythm, normal heart sounds and intact distal pulses.   Pulmonary/Chest: Effort normal and breath sounds normal.  Abdominal: Bowel sounds are normal. There is no tenderness.  Lymphadenopathy:    He has no cervical adenopathy.  Neurological: He is alert and oriented to person, place, and time.  Skin: Skin is warm and dry.  Nursing note and vitals reviewed.   ED  Course  Procedures (including critical care time)  Labs Review Labs Reviewed - No data to display  Imaging Review No results found.   Visual Acuity Review  Right Eye Distance:   Left Eye Distance:   Bilateral Distance:    Right Eye Near:   Left Eye Near:    Bilateral Near:         MDM   1. Influenza-like illness       Linna Hoff, MD 09/15/15 1322

## 2016-07-10 ENCOUNTER — Encounter (HOSPITAL_COMMUNITY): Payer: Self-pay

## 2016-07-10 ENCOUNTER — Ambulatory Visit (HOSPITAL_COMMUNITY)
Admission: EM | Admit: 2016-07-10 | Discharge: 2016-07-10 | Disposition: A | Discharge status: Home / Self Care | Payer: 59 | Attending: Family Medicine | Admitting: Family Medicine

## 2016-07-10 DIAGNOSIS — H6593 Unspecified nonsuppurative otitis media, bilateral: Secondary | ICD-10-CM

## 2016-07-10 MED ORDER — FLUTICASONE PROPIONATE 50 MCG/ACT NA SUSP: 2.0000 | Freq: Every day | NASAL | 0 refills | Status: DC

## 2016-07-10 NOTE — ED Provider Notes (Signed)
CSN: [id number]     Arrival date & time 07/10/16  1443 History   First MD Initiated Contact with Patient 07/10/16 1504     Chief Complaint  Patient presents with  . Ear Fullness   (Consider location/radiation/quality/duration/timing/severity/associated sxs/prior Treatment) Patient complains of bilateral ear fullness and pressure since Thanksgiving. States he has had some congestion but no recent illness. Ears are not painful, he has had no fever, cough, shortness of breath, wheezing, or other symptoms.   The history is provided by the patient.  Ear Fullness     Past Medical History:  Diagnosis Date  . Asthma   . Seasonal allergies    Past Surgical History:  Procedure Laterality Date  . ORIF TOE FRACTURE Right 05/15/2015   Procedure: OPEN REDUCTION INTERNAL FIXATION (ORIF) RIGHT 5TH METATARSAL (TOE) FRACTURE;  Surgeon: Jodi GeraldsJohn Graves, MD;  Location: [facility];  Service: Orthopedics;  Laterality: Right;   No family history on file. Social History  Substance Use Topics  . Smoking status: Never Smoker  . Smokeless tobacco: Never Used  . Alcohol use Yes     Comment: Ocassionally    Review of Systems  Constitutional: Negative.   HENT: Positive for congestion. Negative for ear discharge, ear pain, hearing loss, rhinorrhea, sinus pain and sinus pressure.   Eyes: Negative.   Respiratory: Negative.   Cardiovascular: Negative.   Gastrointestinal: Negative.   Genitourinary: Negative.   Musculoskeletal: Negative.     Allergies  Shellfish allergy  Home Medications   Prior to Admission medications   Medication Sig Start Date End Date Taking? Authorizing Provider  albuterol (PROVENTIL HFA;VENTOLIN HFA) 108 (90 BASE) MCG/ACT inhaler Inhale 2 puffs into the lungs every 6 (six) hours as needed for wheezing or shortness of breath.    Historical Provider, MD  aspirin EC 325 MG tablet Take 1 tablet (325 mg total) by mouth daily. Take 3 weeks postop with food to decrease  risk of blood clot. 05/15/15   Marshia LyJames Bethune, PA-C  fluticasone (FLONASE) 50 MCG/ACT nasal spray Place 2 sprays into both nostrils daily. 07/10/16   Dorena BodoLawrence Rosemarie Galvis, NP  ipratropium (ATROVENT) 0.06 % nasal spray Place 2 sprays into both nostrils 4 (four) times daily. 09/12/15   Linna HoffJames D Kindl, MD  oseltamivir (TAMIFLU) 75 MG capsule Take 1 capsule (75 mg total) by mouth every 12 (twelve) hours. Take all of medication. 09/12/15   Linna HoffJames D Kindl, MD  oxyCODONE-acetaminophen (PERCOCET/ROXICET) 5-325 MG tablet Take 1-2 tablets by mouth every 6 (six) hours as needed for severe pain. 05/15/15   Marshia LyJames Bethune, PA-C   Meds Ordered and Administered this Visit  Medications - No data to display  BP 118/81 (BP Location: Right Arm)   Pulse (!) 55   Temp 97.8 F (36.6 C) (Oral)   Resp 16   SpO2 97%  No data found.   Physical Exam  Constitutional: He is oriented to person, place, and time. He appears well-developed and well-nourished. No distress.  HENT:  Head: Normocephalic and atraumatic.  Right Ear: Hearing, external ear and ear canal normal. No swelling or tenderness. Tympanic membrane is bulging. Tympanic membrane is not injected, not perforated and not erythematous. A middle ear effusion is present. No decreased hearing is noted.  Left Ear: Hearing, external ear and ear canal normal. No swelling or tenderness. Tympanic membrane is bulging. Tympanic membrane is not injected, not perforated and not erythematous. A middle ear effusion is present. No decreased hearing is noted.  Mouth/Throat: Oropharynx is clear  and moist. No oropharyngeal exudate.  Eyes: EOM are normal. Pupils are equal, round, and reactive to light.  Neck: Normal range of motion. Neck supple. No JVD present.  Cardiovascular: Normal rate and regular rhythm.   Pulmonary/Chest: Effort normal and breath sounds normal.  Abdominal: Soft. Bowel sounds are normal.  Lymphadenopathy:    He has no cervical adenopathy.  Neurological: He is alert  and oriented to person, place, and time.  Skin: Skin is warm and dry. Capillary refill takes less than 2 seconds. He is not diaphoretic. No erythema.  Nursing note and vitals reviewed.   Urgent Care Course   Clinical Course     Procedures (including critical care time)  Labs Review Labs Reviewed - No data to display  Imaging Review No results found.   Visual Acuity Review  Right Eye Distance:   Left Eye Distance:   Bilateral Distance:    Right Eye Near:   Left Eye Near:    Bilateral Near:         MDM   1. Otitis media with effusion, bilateral    Patient advised to take a daily antihistamine over the counter and flonase daily. Should symptoms fail to improve or worsen follow up with a primary care provider or return to clinic.     Dorena BodoLawrence Constance Whittle, NP 07/10/16 [phone]

## 2016-07-10 NOTE — ED Triage Notes (Signed)
Pt said both of his ears are clogged since thanksgiving. No pain, no fever and no other symptoms.

## 2016-07-10 NOTE — Discharge Instructions (Signed)
Take an antihistamine of your choice every day such as Claritin. Take Flonse nasal spray 2 sprays in each nostril every day. I recommend obtaining a primary care provider and following up with him or her in 1 week should symptoms fail to improve or worsen.

## 2016-08-15 ENCOUNTER — Ambulatory Visit (HOSPITAL_COMMUNITY)
Admission: EM | Admit: 2016-08-15 | Discharge: 2016-08-15 | Disposition: A | Discharge status: Home / Self Care | Payer: 59 | Attending: Family Medicine | Admitting: Family Medicine

## 2016-08-15 ENCOUNTER — Encounter (HOSPITAL_COMMUNITY): Payer: Self-pay | Admitting: *Deleted

## 2016-08-15 DIAGNOSIS — B309 Viral conjunctivitis, unspecified: Secondary | ICD-10-CM | POA: Diagnosis not present

## 2016-08-15 DIAGNOSIS — J029 Acute pharyngitis, unspecified: Secondary | ICD-10-CM | POA: Diagnosis not present

## 2016-08-15 DIAGNOSIS — J45909 Unspecified asthma, uncomplicated: Secondary | ICD-10-CM | POA: Insufficient documentation

## 2016-08-15 MED ORDER — TOBRAMYCIN 0.3 % OP SOLN: 1.0000 [drp] | Freq: Four times a day (QID) | OPHTHALMIC | 0 refills | Status: DC

## 2016-08-15 MED ORDER — CHLORHEXIDINE GLUCONATE 0.12 % MT SOLN: 15.0000 mL | Freq: Two times a day (BID) | OROMUCOSAL | 0 refills | Status: DC

## 2016-08-15 NOTE — Discharge Instructions (Signed)
I prescribed medicine for your sore throat and eye irritation. He should see improvement in the next 48 hours, or return and let us reevaluate you

## 2016-08-15 NOTE — ED Triage Notes (Signed)
Woke up today with sore throat and redness in right eye without drainage.  Denies fevers.  States "I feel fine otherwise".  Has not taken any meds.

## 2016-08-15 NOTE — ED Provider Notes (Signed)
[facility]    CSN: [id number] Arrival date & time: 08/15/16  1312     History   Chief Complaint Chief Complaint  Patient presents with  . Sore Throat    HPI Reginald Woods is a 26 y.o. male.   This a 26 year old man who Woke up today with sore throat and redness in right eye without drainage.  Denies fevers.  States "I feel fine otherwise".  Has not taken any meds.  Patient is a Consulting civil engineerstudent at [employer].      Past Medical History:  Diagnosis Date  . Asthma   . Seasonal allergies     There are no active problems to display for this patient.   Past Surgical History:  Procedure Laterality Date  . ORIF TOE FRACTURE Right 05/15/2015   Procedure: OPEN REDUCTION INTERNAL FIXATION (ORIF) RIGHT 5TH METATARSAL (TOE) FRACTURE;  Surgeon: Jodi GeraldsJohn Graves, MD;  Location: [facility];  Service: Orthopedics;  Laterality: Right;       Home Medications    Prior to Admission medications   Medication Sig Start Date End Date Taking? Authorizing Provider  albuterol (PROVENTIL HFA;VENTOLIN HFA) 108 (90 BASE) MCG/ACT inhaler Inhale 2 puffs into the lungs every 6 (six) hours as needed for wheezing or shortness of breath.   Yes Historical Provider, MD  chlorhexidine (PERIDEX) 0.12 % solution Use as directed 15 mLs in the mouth or throat 2 (two) times daily. 08/15/16   Elvina SidleKurt Alvetta Hidrogo, MD  tobramycin (TOBREX) 0.3 % ophthalmic solution Place 1 drop into the right eye every 6 (six) hours. 08/15/16   Elvina SidleKurt Sergi Gellner, MD    Family History No family history on file.  Social History Social History  Substance Use Topics  . Smoking status: Never Smoker  . Smokeless tobacco: Never Used  . Alcohol use No     Allergies   Shellfish allergy   Review of Systems Review of Systems  Constitutional: Negative.   HENT: Positive for congestion and sore throat.   Eyes: Positive for redness. Negative for photophobia, pain, discharge, itching and visual  disturbance.  Respiratory: Positive for cough.   Gastrointestinal: Negative.   Musculoskeletal: Negative.   Neurological: Negative.      Physical Exam Triage Vital Signs ED Triage Vitals  Enc Vitals Group     BP 08/15/16 1449 111/65     Pulse Rate 08/15/16 1449 65     Resp 08/15/16 1449 16     Temp 08/15/16 1449 98 F (36.7 C)     Temp Source 08/15/16 1449 Oral     SpO2 08/15/16 1449 100 %     Weight --      Height --      Head Circumference --      Peak Flow --      Pain Score 08/15/16 1452 6     Pain Loc --      Pain Edu? --      Excl. in GC? --      Updated Vital Signs BP 111/65   Pulse 65   Temp 98 F (36.7 C) (Oral)   Resp 16   SpO2 100%   Physical Exam  Constitutional: He is oriented to person, place, and time. He appears well-developed and well-nourished.  HENT:  Head: Normocephalic.  Right Ear: External ear normal.  Left Ear: External ear normal.  Mouth/Throat: Oropharynx is clear and moist.  Mild posterior pharyngeal erythema  Eyes: EOM are normal. Pupils are equal, round,  and reactive to light.  Injected right conjunctiva  Neck: Normal range of motion. Neck supple.  Cardiovascular: Normal heart sounds.   Pulmonary/Chest: Effort normal and breath sounds normal.  Musculoskeletal: Normal range of motion.  Lymphadenopathy:    He has no cervical adenopathy.  Neurological: He is alert and oriented to person, place, and time.  Skin: Skin is warm and dry.  Nursing note and vitals reviewed.    UC Treatments / Results  Labs (all labs ordered are listed, but only abnormal results are displayed) EKG  EKG Interpretation None       Radiology  Procedures Procedures   Medications Ordered in UC   Initial Impression / Assessment and Plan / UC Course  I have reviewed the triage vital signs and the nursing notes.  Pertinent labs & imaging results that were available during my care of the patient were reviewed by me and considered in my medical  decision making (see chart for details).     Final Clinical Impressions(s) / UC Diagnoses   Final diagnoses:  Sore throat  Acute viral conjunctivitis of right eye    New Prescriptions New Prescriptions   CHLORHEXIDINE (PERIDEX) 0.12 % SOLUTION    Use as directed 15 mLs in the mouth or throat 2 (two) times daily.   TOBRAMYCIN (TOBREX) 0.3 % OPHTHALMIC SOLUTION    Place 1 drop into the right eye every 6 (six) hours.     Elvina Sidle, MD 08/15/16 [phone]

## 2016-08-16 LAB — CULTURE, GROUP A STREP (THRC)

## 2016-08-17 LAB — POCT RAPID STREP A: Streptococcus, Group A Screen (Direct): NEGATIVE

## 2016-10-05 ENCOUNTER — Ambulatory Visit (HOSPITAL_COMMUNITY)
Admission: EM | Admit: 2016-10-05 | Discharge: 2016-10-05 | Disposition: A | Discharge status: Home / Self Care | Payer: 59 | Attending: Internal Medicine | Admitting: Internal Medicine

## 2016-10-05 ENCOUNTER — Encounter (HOSPITAL_COMMUNITY): Payer: Self-pay | Admitting: Emergency Medicine

## 2016-10-05 DIAGNOSIS — J029 Acute pharyngitis, unspecified: Secondary | ICD-10-CM | POA: Diagnosis not present

## 2016-10-05 DIAGNOSIS — R6889 Other general symptoms and signs: Secondary | ICD-10-CM | POA: Diagnosis not present

## 2016-10-05 DIAGNOSIS — J039 Acute tonsillitis, unspecified: Secondary | ICD-10-CM

## 2016-10-05 DIAGNOSIS — M791 Myalgia: Secondary | ICD-10-CM

## 2016-10-05 LAB — POCT RAPID STREP A: Streptococcus, Group A Screen (Direct): NEGATIVE

## 2016-10-05 MED ORDER — ACETAMINOPHEN 325 MG PO TABS
ORAL_TABLET | ORAL | Status: AC
  Filled 2016-10-05: qty 2

## 2016-10-05 MED ORDER — ONDANSETRON HCL 4 MG PO TABS: 4.0000 mg | ORAL_TABLET | Freq: Four times a day (QID) | ORAL | 0 refills | Status: DC

## 2016-10-05 MED ORDER — OSELTAMIVIR PHOSPHATE 75 MG PO CAPS: 75.0000 mg | ORAL_CAPSULE | Freq: Two times a day (BID) | ORAL | 0 refills | Status: DC

## 2016-10-05 MED ORDER — ACETAMINOPHEN 325 MG PO TABS
650.0000 mg | ORAL_TABLET | Freq: Once | ORAL | Status: AC
  Administered 2016-10-05: 650 mg via ORAL

## 2016-10-05 NOTE — ED Triage Notes (Signed)
[facility] a [facility] of body aches and chills x 3 days.

## 2016-10-05 NOTE — Discharge Instructions (Signed)

## 2016-10-05 NOTE — ED Provider Notes (Signed)
CSN: [id number]     Arrival date & time 10/05/16  0957 History   First MD Initiated Contact with Patient 10/05/16 1023     Chief Complaint  Patient presents with  . Generalized Body Aches   (Consider location/radiation/quality/duration/timing/severity/associated sxs/prior Treatment) HPI Reginald Woods is a 26 y.o. male presenting to UC with c/o body aches, chills, mild congestion, sore throat, and headache for 3 days. Pt denies vomiting but was gagging yesterday due to nausea.  Denies diarrhea.  He has taken Dayquil with mild relief. Last dose was yesterday.  He did not get the flu vaccine this season.  Denies sick contacts or recent travel.  He of asthma. Denies chest pain or SOB.   Past Medical History:  Diagnosis Date  . Asthma   . Seasonal allergies    Past Surgical History:  Procedure Laterality Date  . ORIF TOE FRACTURE Right 05/15/2015   Procedure: OPEN REDUCTION INTERNAL FIXATION (ORIF) RIGHT 5TH METATARSAL (TOE) FRACTURE;  Surgeon: Jodi Geralds, MD;  Location: [facility];  Service: Orthopedics;  Laterality: Right;   History reviewed. No pertinent family history. Social History  Substance Use Topics  . Smoking status: Never Smoker  . Smokeless tobacco: Never Used  . Alcohol use No    Review of Systems  Constitutional: Positive for chills and fever ( subjective).  HENT: Positive for congestion and sore throat. Negative for ear pain, trouble swallowing and voice change.   Respiratory: Negative for cough and shortness of breath.   Cardiovascular: Negative for chest pain and palpitations.  Gastrointestinal: Negative for abdominal pain, diarrhea, nausea and vomiting.  Musculoskeletal: Positive for arthralgias, back pain and myalgias.       Body aches  Skin: Negative for rash.  Neurological: Positive for headaches. Negative for dizziness and light-headedness.    Allergies  Shellfish allergy  Home Medications   Prior to Admission medications   Medication  Sig Start Date End Date Taking? Authorizing Provider  ondansetron (ZOFRAN) 4 MG tablet Take 1 tablet (4 mg total) by mouth every 6 (six) hours. 10/05/16   Junius Finner, PA-C  oseltamivir (TAMIFLU) 75 MG capsule Take 1 capsule (75 mg total) by mouth every 12 (twelve) hours. 10/05/16   Junius Finner, PA-C   Meds Ordered and Administered this Visit   Medications  acetaminophen (TYLENOL) tablet 650 mg (650 mg Oral Given 10/05/16 1035)    BP 115/67 (BP Location: Right Arm)   Pulse (!) 104   Temp (!) 103.1 F (39.5 C) (Oral)   Resp 18   SpO2 100%  No data found.   Physical Exam  Constitutional: He appears well-developed and well-nourished. No distress.  HENT:  Head: Normocephalic and atraumatic.  Right Ear: Tympanic membrane normal.  Left Ear: Tympanic membrane normal.  Nose: Nose normal.  Mouth/Throat: Uvula is midline and mucous membranes are normal. Oropharyngeal exudate, posterior oropharyngeal edema and posterior oropharyngeal erythema present. No tonsillar abscesses.  Eyes: Conjunctivae are normal. No scleral icterus.  Neck: Normal range of motion. Neck supple.  Cardiovascular: Normal rate, regular rhythm and normal heart sounds.   Pulmonary/Chest: Effort normal and breath sounds normal. No stridor. No respiratory distress. He has no wheezes. He has no rales.  Abdominal: Soft. He exhibits no distension. There is no tenderness.  Musculoskeletal: Normal range of motion.  Lymphadenopathy:    He has cervical adenopathy.  Neurological: He is alert.  Skin: Skin is warm and dry. He is not diaphoretic.  Nursing note and vitals reviewed.   Urgent  Care Course     Procedures (including critical care time)  Labs Review Labs Reviewed  POCT RAPID STREP A    Imaging Review No results found.    MDM   1. Flu-like symptoms   2. Exudative tonsillitis    Rapid strep: Negative Culture sent   Symptoms are c/w influenza. Still seeing cases of Flu A and Flu B in the community.  Will treat for influenza Rx: Tamiflu   Encouraged fluids, rest, acetaminophen, and ibuprofen  f/u with PCP in 1 week if not improving, sooner if worsening.    Junius Finnerrin O'Malley, PA-C 10/05/16 1054

## 2016-10-06 LAB — CULTURE, GROUP A STREP (THRC)

## 2018-02-08 ENCOUNTER — Ambulatory Visit (HOSPITAL_COMMUNITY)
Admission: EM | Admit: 2018-02-08 | Discharge: 2018-02-08 | Disposition: A | Discharge status: Home / Self Care | Payer: 59

## 2019-06-05 ENCOUNTER — Other Ambulatory Visit: Payer: Self-pay

## 2019-06-05 DIAGNOSIS — Z20822 Contact with and (suspected) exposure to covid-19: Secondary | ICD-10-CM

## 2019-06-06 LAB — NOVEL CORONAVIRUS, NAA: SARS-CoV-2, NAA: NOT DETECTED

## 2019-06-29 ENCOUNTER — Ambulatory Visit: Payer: 59 | Attending: Internal Medicine

## 2019-06-29 DIAGNOSIS — Z20822 Contact with and (suspected) exposure to covid-19: Secondary | ICD-10-CM

## 2019-07-01 LAB — NOVEL CORONAVIRUS, NAA: SARS-CoV-2, NAA: NOT DETECTED

## 2019-07-04 ENCOUNTER — Other Ambulatory Visit: Payer: 59

## 2019-07-11 ENCOUNTER — Ambulatory Visit: Payer: 59 | Attending: Internal Medicine

## 2020-01-30 ENCOUNTER — Other Ambulatory Visit: Payer: Self-pay

## 2020-01-30 ENCOUNTER — Ambulatory Visit: Payer: Self-pay

## 2020-01-30 ENCOUNTER — Other Ambulatory Visit: Payer: Self-pay | Admitting: Family Medicine

## 2020-01-30 DIAGNOSIS — Z0000 Encounter for general adult medical examination without abnormal findings: Secondary | ICD-10-CM

## 2021-03-05 IMAGING — DX DG CHEST 1V
1 series · 1 of 1 positions shown · non-contrast
Comparison: June 19, 2008

CLINICAL DATA: Physical exam

EXAM:
CHEST  1 VIEW

[chest pa]
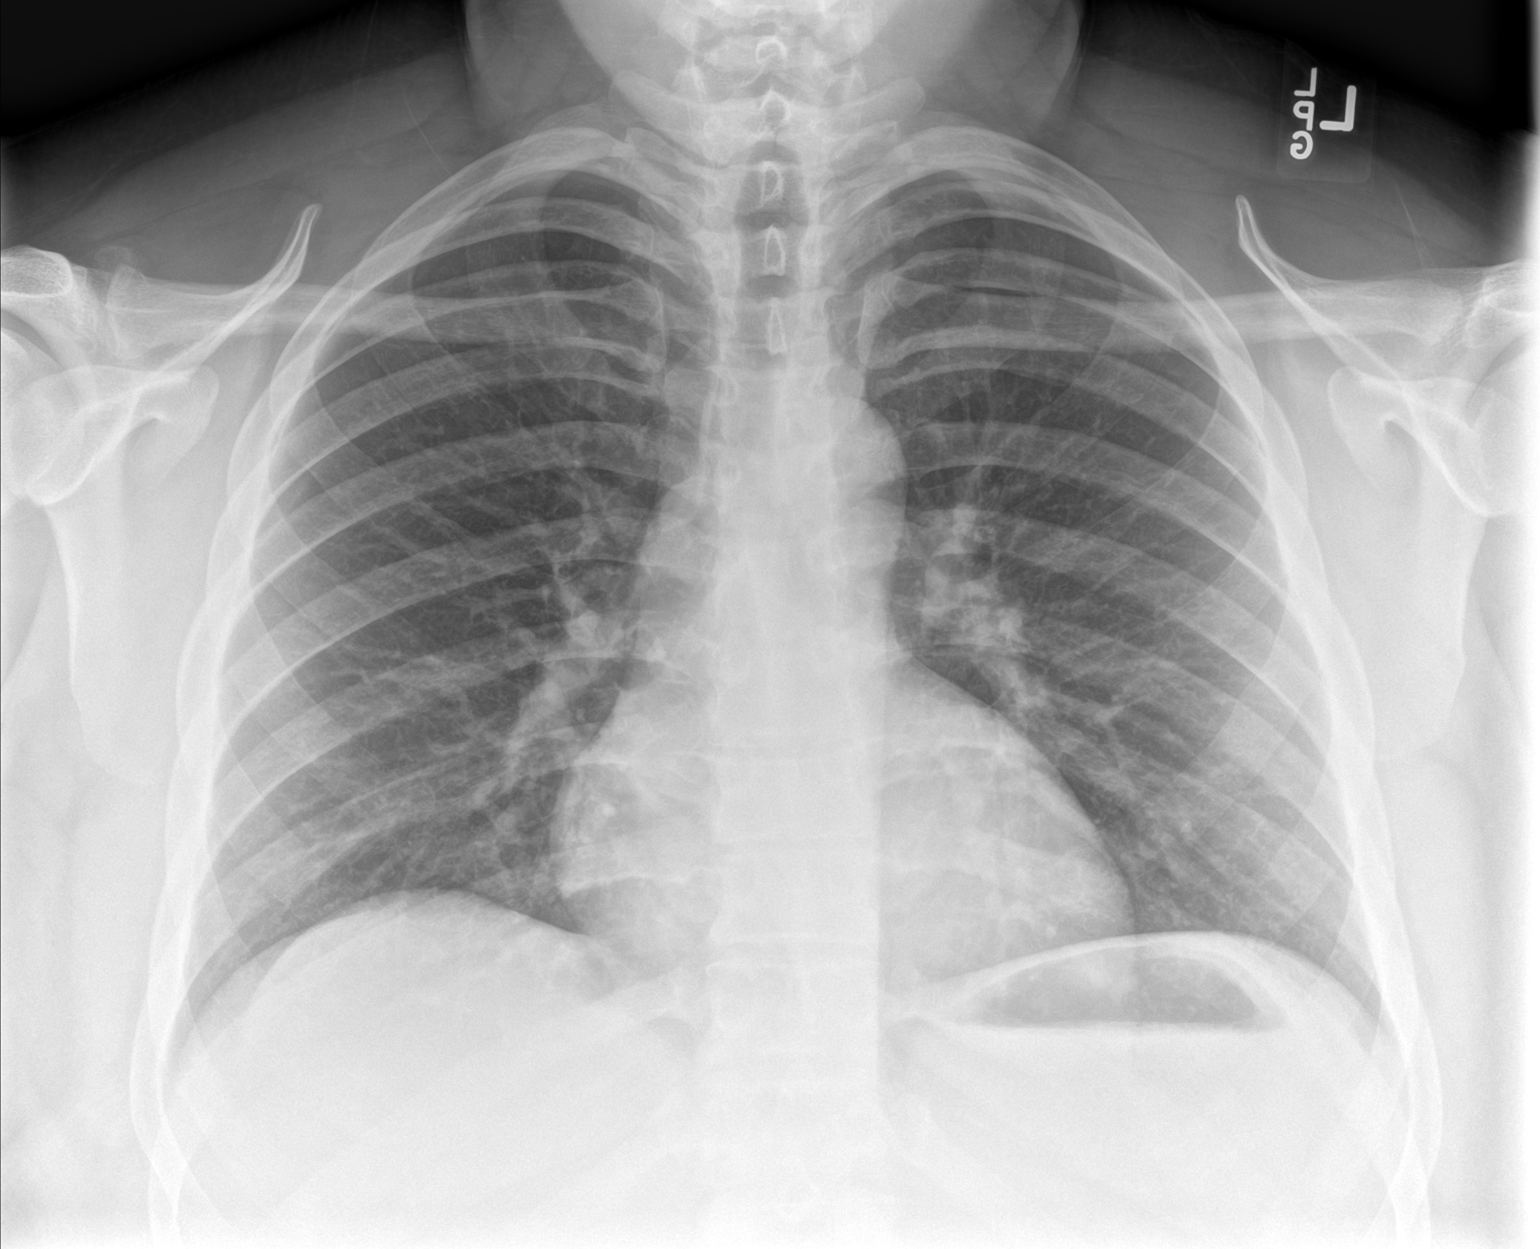

[1 of 1 positions shown; findings below may reference images not displayed]

FINDINGS: The cardiomediastinal silhouette is normal in contour. No pleural
effusion. No pneumothorax. No acute pleuroparenchymal abnormality.
Visualized abdomen is unremarkable. No acute osseous abnormality
noted.
IMPRESSION: No acute cardiopulmonary abnormality.

## 2021-09-09 ENCOUNTER — Encounter (HOSPITAL_COMMUNITY): Payer: Self-pay

## 2021-09-09 ENCOUNTER — Ambulatory Visit (HOSPITAL_COMMUNITY)
Admission: RE | Admit: 2021-09-09 | Discharge: 2021-09-09 | Disposition: A | Discharge status: Home / Self Care | Payer: 59 | Source: Ambulatory Visit | Attending: Urgent Care | Admitting: Urgent Care

## 2021-09-09 ENCOUNTER — Other Ambulatory Visit: Payer: Self-pay

## 2021-09-09 VITALS — BP 124/85 | HR 71 | Temp 98.4°F | Resp 18 | Ht 70.0 in | Wt 283.1 lb

## 2021-09-09 DIAGNOSIS — Z202 Contact with and (suspected) exposure to infections with a predominantly sexual mode of transmission: Secondary | ICD-10-CM | POA: Diagnosis not present

## 2021-09-09 NOTE — ED Triage Notes (Signed)
Pt requesting information on HSV and wants to see if he can get suppressive therapy. Also requesting STD testing. Denies any symptoms.

## 2021-09-09 NOTE — ED Provider Notes (Signed)
[facility]   MRN: [id number] DOB: July 23, 1990  Subjective:   Reginald Woods is a 31 y.o. male presenting for consultation regarding HSV testing.  Patient states that in October he had blood work done for the antibodies against HSV.  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  He has done a lot of research and is very confused about what this means.  He initially got the test done because he had sex with a male who had a previous sex partner that tested positive for the antibodies do blood work as well.  So he decided to get tested himself.  He does not know if he has genital herpes or not and would like to confirm this.  Denies any particular cold sores, rashes, painful urination, urinary symptoms.  He is sexually active and has 1 male partner.  He is trying to be judicious about what he is to say regarding his HSV testing.  No current facility-administered medications for this encounter.  Current Outpatient Medications:    ondansetron (ZOFRAN) 4 MG tablet, Take 1 tablet (4 mg total) by mouth every 6 (six) hours., Disp: 12 tablet, Rfl: 0   oseltamivir (TAMIFLU) 75 MG capsule, Take 1 capsule (75 mg total) by mouth every 12 (twelve) hours., Disp: 10 capsule, Rfl: 0   Allergies  Allergen Reactions   Shellfish Allergy     Past Medical History:  Diagnosis Date   Asthma    Seasonal allergies      Past Surgical History:  Procedure Laterality Date   ORIF TOE FRACTURE Right 05/15/2015   Procedure: OPEN REDUCTION INTERNAL FIXATION (ORIF) RIGHT 5TH METATARSAL (TOE) FRACTURE;  Surgeon: Jodi Geralds, MD;  Location: [facility];  Service: Orthopedics;  Laterality: Right;    No family history on file.  Social History   Tobacco Use   Smoking status: Never   Smokeless tobacco: Never  Substance Use Topics   Alcohol use: No   Drug use: No    Types: Marijuana    Comment: last smoke marijuana 05/12/2015    ROS   Objective:   Vitals: Ht 5\' 10"   (1.778 m)    Wt 283 lb 1.1 oz (128.4 kg)    BMI 40.62 kg/m   Physical Exam Constitutional:      General: He is not in acute distress.    Appearance: Normal appearance. He is well-developed and normal weight. He is not ill-appearing, toxic-appearing or diaphoretic.  HENT:     Head: Normocephalic and atraumatic.     Right Ear: External ear normal.     Left Ear: External ear normal.     Nose: Nose normal.     Mouth/Throat:     Pharynx: Oropharynx is clear.  Eyes:     General: No scleral icterus.       Right eye: No discharge.        Left eye: No discharge.     Extraocular Movements: Extraocular movements intact.  Cardiovascular:     Rate and Rhythm: Normal rate.  Pulmonary:     Effort: Pulmonary effort is normal.  Musculoskeletal:     Cervical back: Normal range of motion.  Neurological:     Mental Status: He is alert and oriented to person, place, and time.  Psychiatric:        Mood and Affect: Mood normal.        Behavior: Behavior normal.        Thought Content: Thought content normal.  Judgment: Judgment normal.    Assessment and Plan :   PDMP not reviewed this encounter.  1. Possible exposure to STD     I counseled patient that the HSV antibody tests through blood work are not specific.  I could not formally make a diagnosis of genital herpes with that kind of blood work.  I also advised against repeat blood test as they are likely to remain positive.  As patient has never had any genital rash or cold sore that he can recall I counseled that he does not require any suppression therapy.  I did encourage safe sex practices and open dialogue about his testing.  However, I do not believe that the appropriate diagnosis for this patient's blood work is genital herpes.  We discussed this at length, patient verbalized understanding and declined any further tests.  Anticipatory guidance provided.   Wallis Bamberg, [place] 09/09/21 1552

## 2021-09-10 LAB — CYTOLOGY, (ORAL, ANAL, URETHRAL) ANCILLARY ONLY
Chlamydia: NEGATIVE
Comment: NEGATIVE
Comment: NEGATIVE
Comment: NORMAL
Neisseria Gonorrhea: NEGATIVE
Trichomonas: NEGATIVE

## 2023-07-26 ENCOUNTER — Ambulatory Visit (HOSPITAL_COMMUNITY)
Admission: RE | Admit: 2023-07-26 | Discharge: 2023-07-26 | Disposition: A | Discharge status: Home / Self Care | Payer: 59 | Source: Ambulatory Visit | Attending: Nurse Practitioner | Admitting: Nurse Practitioner

## 2023-07-26 ENCOUNTER — Encounter (HOSPITAL_COMMUNITY): Payer: Self-pay

## 2023-07-26 ENCOUNTER — Other Ambulatory Visit: Payer: Self-pay

## 2023-07-26 VITALS — BP 125/81 | HR 90 | Temp 98.3°F | Resp 20

## 2023-07-26 DIAGNOSIS — H60392 Other infective otitis externa, left ear: Secondary | ICD-10-CM

## 2023-07-26 MED ORDER — OFLOXACIN 0.3 % OT SOLN: 10.0000 [drp] | Freq: Every day | OTIC | 0 refills | Status: AC

## 2023-07-26 NOTE — ED Triage Notes (Signed)
 Left ear is irritated and muffled hearing.  Symptoms intermittent for 2 weeks.  Denies using any eardrops or q-tips in ear

## 2023-07-26 NOTE — ED Provider Notes (Signed)
 [facility]    CSN: [id number] Arrival date & time: 07/26/23  0840      History   Chief Complaint Chief Complaint  Patient presents with   Appointment    9:00   Ear Fullness    HPI Reginald Woods is a 33 y.o. male.   Patient presents today for 1 year history of intermittent muffled hearing to the left ear.  He reports he has had multiple hearing tests at his work and has followed up with an ENT; but is unclear what was recommended.  He has not followed up with ENT since.  He denies any ear drainage, fever, cough, congestion, or sore throat.  Denies Q-tip use or eardrop use recently.  Reports he does use earplugs daily at work.    Past Medical History:  Diagnosis Date   Asthma    Seasonal allergies     There are no active problems to display for this patient.   Past Surgical History:  Procedure Laterality Date   ORIF TOE FRACTURE Right 05/15/2015   Procedure: OPEN REDUCTION INTERNAL FIXATION (ORIF) RIGHT 5TH METATARSAL (TOE) FRACTURE;  Surgeon: Norleen Gavel, MD;  Location: [facility];  Service: Orthopedics;  Laterality: Right;       Home Medications    Prior to Admission medications   Medication Sig Start Date End Date Taking? Authorizing Provider  ofloxacin  (FLOXIN ) 0.3 % OTIC solution Place 10 drops into the left ear daily for 7 days. 07/26/23 08/02/23 Yes Chandra Harlene LABOR, NP    Family History Family History  Problem Relation Age of Onset   Healthy Mother    Healthy Father     Social History Social History   Tobacco Use   Smoking status: Never   Smokeless tobacco: Never  Vaping Use   Vaping status: Never Used  Substance Use Topics   Alcohol use: Yes   Drug use: No    Types: Marijuana    Comment: last smoke marijuana 05/12/2015     Allergies   Shellfish allergy   Review of Systems Review of Systems Per HPI  Physical Exam Triage Vital Signs ED Triage Vitals  Encounter Vitals Group     BP 07/26/23 0921 125/81      Systolic BP Percentile --      Diastolic BP Percentile --      Pulse Rate 07/26/23 0921 90     Resp 07/26/23 0921 20     Temp 07/26/23 0921 98.3 F (36.8 C)     Temp Source 07/26/23 0921 Oral     SpO2 07/26/23 0921 97 %     Weight --      Height --      Head Circumference --      Peak Flow --      Pain Score 07/26/23 0919 2     Pain Loc --      Pain Education --      Exclude from Growth Chart --    No data found.  Updated Vital Signs BP 125/81 (BP Location: Right Arm) Comment (BP Location): large cuff  Pulse 90   Temp 98.3 F (36.8 C) (Oral)   Resp 20   SpO2 97%   Visual Acuity Right Eye Distance:   Left Eye Distance:   Bilateral Distance:    Right Eye Near:   Left Eye Near:    Bilateral Near:     Physical Exam Vitals and nursing note reviewed.  Constitutional:  General: He is not in acute distress.    Appearance: Normal appearance. He is not toxic-appearing.  HENT:     Head: Normocephalic and atraumatic.     Right Ear: Tympanic membrane, ear canal and external ear normal.     Left Ear: Swelling present. No drainage or tenderness.  No middle ear effusion.     Nose: Nose normal. No congestion or rhinorrhea.     Mouth/Throat:     Mouth: Mucous membranes are moist.     Pharynx: Oropharynx is clear. No posterior oropharyngeal erythema.  Eyes:     General:        Right eye: No discharge.        Left eye: No discharge.     Extraocular Movements: Extraocular movements intact.  Pulmonary:     Effort: Pulmonary effort is normal. No respiratory distress.  Musculoskeletal:     Cervical back: Normal range of motion.  Lymphadenopathy:     Cervical: No cervical adenopathy.  Skin:    General: Skin is warm and dry.     Capillary Refill: Capillary refill takes less than 2 seconds.     Coloration: Skin is not jaundiced or pale.     Findings: No erythema.  Neurological:     Mental Status: He is alert and oriented to person, place, and time.  Psychiatric:         Behavior: Behavior is cooperative.      UC Treatments / Results  Labs (all labs ordered are listed, but only abnormal results are displayed) Labs Reviewed - No data to display  EKG   Radiology No results found.  Procedures Procedures (including critical care time)  Medications Ordered in UC Medications - No data to display  Initial Impression / Assessment and Plan / UC Course  I have reviewed the triage vital signs and the nursing notes.  Pertinent labs & imaging results that were available during my care of the patient were reviewed by me and considered in my medical decision making (see chart for details).   Patient is well-appearing, normotensive, afebrile, not tachycardic, not tachypneic, oxygenating well on room air.    1. Infective otitis externa of left ear Treat with ofloxacin  drops daily for 7 days Supportive care discussed Recommended follow-up with ENT, especially if symptoms do not fully improve with treatment  The patient was given the opportunity to ask questions.  All questions answered to their satisfaction.  The patient is in agreement to this plan.    Final Clinical Impressions(s) / UC Diagnoses   Final diagnoses:  Infective otitis externa of left ear     Discharge Instructions      Use eardrops daily for 7 days to treat for an outer ear infection.  Apply a Vaseline tipped cotton ball to your ear after using the eardrops and whenever water may get in your ear to prevent further irritation.  Stop using earbuds and recommend over-the-head earphones.  Follow-up with ENT if symptoms do not fully improve with treatment.    ED Prescriptions     Medication Sig Dispense Auth. Provider   ofloxacin  (FLOXIN ) 0.3 % OTIC solution Place 10 drops into the left ear daily for 7 days. 5 mL Chandra Harlene LABOR, NP      PDMP not reviewed this encounter.   Chandra Harlene LABOR, NP 07/26/23 1322

## 2023-07-26 NOTE — Discharge Instructions (Signed)
 Use eardrops daily for 7 days to treat for an outer ear infection.  Apply a Vaseline tipped cotton ball to your ear after using the eardrops and whenever water may get in your ear to prevent further irritation.  Stop using earbuds and recommend over-the-head earphones.  Follow-up with ENT if symptoms do not fully improve with treatment.
